# Patient Record
Sex: Male | Born: 1995 | Race: White | Marital: Single
Health system: Southern US, Community
[De-identification: ages and names within clinical notes are randomized; demographics above are authoritative.]

## PROBLEM LIST (undated history)

## (undated) DIAGNOSIS — J45909 Unspecified asthma, uncomplicated: Secondary | ICD-10-CM

---
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
## 2019-01-23 ENCOUNTER — Inpatient Hospital Stay (HOSPITAL_COMMUNITY)
Admission: EM | Admit: 2019-01-23 | Discharge: 2019-01-26 | DRG: 208 | Disposition: A | Discharge status: Home / Self Care | Payer: BC Managed Care – PPO | Attending: General Surgery | Admitting: General Surgery

## 2019-01-23 ENCOUNTER — Emergency Department (HOSPITAL_COMMUNITY): Payer: BC Managed Care – PPO

## 2019-01-23 ENCOUNTER — Encounter (HOSPITAL_COMMUNITY): Payer: Self-pay | Admitting: General Surgery

## 2019-01-23 DIAGNOSIS — S60812A Abrasion of left wrist, initial encounter: Secondary | ICD-10-CM | POA: Diagnosis present

## 2019-01-23 DIAGNOSIS — R402212 Coma scale, best verbal response, none, at arrival to emergency department: Secondary | ICD-10-CM | POA: Diagnosis present

## 2019-01-23 DIAGNOSIS — S70212A Abrasion, left hip, initial encounter: Secondary | ICD-10-CM | POA: Diagnosis present

## 2019-01-23 DIAGNOSIS — S60419A Abrasion of unspecified finger, initial encounter: Secondary | ICD-10-CM | POA: Diagnosis present

## 2019-01-23 DIAGNOSIS — S50312A Abrasion of left elbow, initial encounter: Secondary | ICD-10-CM | POA: Diagnosis present

## 2019-01-23 DIAGNOSIS — R402332 Coma scale, best motor response, abnormal, at arrival to emergency department: Secondary | ICD-10-CM | POA: Diagnosis present

## 2019-01-23 DIAGNOSIS — Z23 Encounter for immunization: Secondary | ICD-10-CM

## 2019-01-23 DIAGNOSIS — S30810A Abrasion of lower back and pelvis, initial encounter: Secondary | ICD-10-CM | POA: Diagnosis present

## 2019-01-23 DIAGNOSIS — T1490XA Injury, unspecified, initial encounter: Secondary | ICD-10-CM | POA: Diagnosis present

## 2019-01-23 DIAGNOSIS — R52 Pain, unspecified: Secondary | ICD-10-CM

## 2019-01-23 DIAGNOSIS — S2232XA Fracture of one rib, left side, initial encounter for closed fracture: Secondary | ICD-10-CM

## 2019-01-23 DIAGNOSIS — F17220 Nicotine dependence, chewing tobacco, uncomplicated: Secondary | ICD-10-CM | POA: Diagnosis present

## 2019-01-23 DIAGNOSIS — R4182 Altered mental status, unspecified: Secondary | ICD-10-CM | POA: Diagnosis not present

## 2019-01-23 DIAGNOSIS — R339 Retention of urine, unspecified: Secondary | ICD-10-CM | POA: Diagnosis not present

## 2019-01-23 DIAGNOSIS — S270XXA Traumatic pneumothorax, initial encounter: Secondary | ICD-10-CM | POA: Diagnosis not present

## 2019-01-23 DIAGNOSIS — Z20828 Contact with and (suspected) exposure to other viral communicable diseases: Secondary | ICD-10-CM | POA: Diagnosis present

## 2019-01-23 DIAGNOSIS — J9601 Acute respiratory failure with hypoxia: Secondary | ICD-10-CM | POA: Diagnosis present

## 2019-01-23 DIAGNOSIS — J939 Pneumothorax, unspecified: Secondary | ICD-10-CM

## 2019-01-23 DIAGNOSIS — S060X9A Concussion with loss of consciousness of unspecified duration, initial encounter: Secondary | ICD-10-CM

## 2019-01-23 DIAGNOSIS — Z01818 Encounter for other preprocedural examination: Secondary | ICD-10-CM

## 2019-01-23 DIAGNOSIS — S060X0A Concussion without loss of consciousness, initial encounter: Secondary | ICD-10-CM | POA: Diagnosis present

## 2019-01-23 DIAGNOSIS — M25512 Pain in left shoulder: Secondary | ICD-10-CM

## 2019-01-23 DIAGNOSIS — S20412A Abrasion of left back wall of thorax, initial encounter: Secondary | ICD-10-CM | POA: Diagnosis present

## 2019-01-23 DIAGNOSIS — Z885 Allergy status to narcotic agent status: Secondary | ICD-10-CM

## 2019-01-23 DIAGNOSIS — R402112 Coma scale, eyes open, never, at arrival to emergency department: Secondary | ICD-10-CM | POA: Diagnosis present

## 2019-01-23 HISTORY — DX: Unspecified asthma, uncomplicated: J45.909

## 2019-01-23 LAB — POCT I-STAT 7, (LYTES, BLD GAS, ICA,H+H)
Acid-base deficit: 1 mmol/L (ref 0.0–2.0)
Bicarbonate: 24 mmol/L (ref 20.0–28.0)
Calcium, Ion: 1.15 mmol/L (ref 1.15–1.40)
HCT: 41 % (ref 39.0–52.0)
Hemoglobin: 13.9 g/dL (ref 13.0–17.0)
O2 Saturation: 100 %
Patient temperature: 98.6
Potassium: 3.5 mmol/L (ref 3.5–5.1)
Sodium: 138 mmol/L (ref 135–145)
TCO2: 25 mmol/L (ref 22–32)
pCO2 arterial: 39.8 mmHg (ref 32.0–48.0)
pH, Arterial: 7.387 (ref 7.350–7.450)
pO2, Arterial: 178 mmHg — ABNORMAL HIGH (ref 83.0–108.0)

## 2019-01-23 LAB — I-STAT CHEM 8, ED
BUN: 15 mg/dL (ref 6–20)
Calcium, Ion: 1.03 mmol/L — ABNORMAL LOW (ref 1.15–1.40)
Chloride: 105 mmol/L (ref 98–111)
Creatinine, Ser: 1 mg/dL (ref 0.61–1.24)
Glucose, Bld: 113 mg/dL — ABNORMAL HIGH (ref 70–99)
HCT: 40 % (ref 39.0–52.0)
Hemoglobin: 13.6 g/dL (ref 13.0–17.0)
Potassium: 3.2 mmol/L — ABNORMAL LOW (ref 3.5–5.1)
Sodium: 138 mmol/L (ref 135–145)
TCO2: 25 mmol/L (ref 22–32)

## 2019-01-23 LAB — COMPREHENSIVE METABOLIC PANEL
ALT: 25 U/L (ref 0–44)
AST: 27 U/L (ref 15–41)
Albumin: 3.9 g/dL (ref 3.5–5.0)
Alkaline Phosphatase: 34 U/L — ABNORMAL LOW (ref 38–126)
Anion gap: 13 (ref 5–15)
BUN: 16 mg/dL (ref 6–20)
CO2: 20 mmol/L — ABNORMAL LOW (ref 22–32)
Calcium: 8.8 mg/dL — ABNORMAL LOW (ref 8.9–10.3)
Chloride: 104 mmol/L (ref 98–111)
Creatinine, Ser: 1.1 mg/dL (ref 0.61–1.24)
GFR calc Af Amer: >60 mL/min (ref >60)
GFR calc non Af Amer: >60 mL/min (ref >60)
Glucose, Bld: 114 mg/dL — ABNORMAL HIGH (ref 70–99)
Potassium: 3.3 mmol/L — ABNORMAL LOW (ref 3.5–5.1)
Sodium: 137 mmol/L (ref 135–145)
Total Bilirubin: 0.5 mg/dL (ref 0.3–1.2)
Total Protein: 6.6 g/dL (ref 6.5–8.1)

## 2019-01-23 LAB — CBC
HCT: 43.5 % (ref 39.0–52.0)
Hemoglobin: 14.8 g/dL (ref 13.0–17.0)
MCH: 31 pg (ref 26.0–34.0)
MCHC: 34 g/dL (ref 30.0–36.0)
MCV: 91.2 fL (ref 80.0–100.0)
Platelets: 310 10*3/uL (ref 150–400)
RBC: 4.77 MIL/uL (ref 4.22–5.81)
RDW: 12.5 % (ref 11.5–15.5)
WBC: 9.9 10*3/uL (ref 4.0–10.5)
nRBC: 0 % (ref 0.0–0.2)

## 2019-01-23 LAB — PROTIME-INR
INR: 1.1 (ref 0.8–1.2)
Prothrombin Time: 14.2 seconds (ref 11.4–15.2)

## 2019-01-23 LAB — ETHANOL: Alcohol, Ethyl (B): <10 mg/dL (ref <10)

## 2019-01-23 LAB — SAMPLE TO BLOOD BANK

## 2019-01-23 LAB — LACTIC ACID, PLASMA: Lactic Acid, Venous: 3 mmol/L (ref 0.5–1.9)

## 2019-01-23 MED ORDER — IOHEXOL 300 MG/ML  SOLN
100.0000 mL | Freq: Once | INTRAMUSCULAR | Status: AC | PRN
  Administered 2019-01-23: 100 mL via INTRAVENOUS

## 2019-01-23 MED ORDER — DEXTROSE-NACL 5-0.9 % IV SOLN
INTRAVENOUS | Status: DC
  Administered 2019-01-24 – 2019-01-25 (×4): via INTRAVENOUS

## 2019-01-23 MED ORDER — MIDAZOLAM HCL 2 MG/2ML IJ SOLN
INTRAMUSCULAR | Status: AC
  Filled 2019-01-23: qty 2

## 2019-01-23 MED ORDER — ENOXAPARIN SODIUM 40 MG/0.4ML ~~LOC~~ SOLN
40.0000 mg | Freq: Every day | SUBCUTANEOUS | Status: DC
  Administered 2019-01-24 – 2019-01-26 (×3): 40 mg via SUBCUTANEOUS
  Filled 2019-01-23 (×4): qty 0.4

## 2019-01-23 MED ORDER — ONDANSETRON HCL 4 MG/2ML IJ SOLN: 4.0000 mg | Freq: Four times a day (QID) | INTRAMUSCULAR | Status: DC | PRN

## 2019-01-23 MED ORDER — PROPOFOL 1000 MG/100ML IV EMUL
INTRAVENOUS | Status: AC
  Filled 2019-01-23: qty 100

## 2019-01-23 MED ORDER — TETANUS-DIPHTH-ACELL PERTUSSIS 5-2.5-18.5 LF-MCG/0.5 IM SUSP
0.5000 mL | Freq: Once | INTRAMUSCULAR | Status: AC
  Administered 2019-01-24: 0.5 mL via INTRAMUSCULAR
  Filled 2019-01-23: qty 0.5

## 2019-01-23 MED ORDER — CHLORHEXIDINE GLUCONATE 0.12% ORAL RINSE (MEDLINE KIT)
15.0000 mL | Freq: Two times a day (BID) | OROMUCOSAL | Status: DC
  Administered 2019-01-24 – 2019-01-25 (×3): 15 mL via OROMUCOSAL

## 2019-01-23 MED ORDER — MIDAZOLAM HCL 5 MG/5ML IJ SOLN
INTRAMUSCULAR | Status: AC | PRN
  Administered 2019-01-23: 2 mg via INTRAVENOUS

## 2019-01-23 MED ORDER — ONDANSETRON 4 MG PO TBDP
4.0000 mg | ORAL_TABLET | Freq: Four times a day (QID) | ORAL | Status: DC | PRN
  Filled 2019-01-23: qty 1

## 2019-01-23 MED ORDER — ORAL CARE MOUTH RINSE
15.0000 mL | OROMUCOSAL | Status: DC
  Administered 2019-01-24 – 2019-01-25 (×13): 15 mL via OROMUCOSAL

## 2019-01-23 MED ORDER — PROPOFOL 1000 MG/100ML IV EMUL
5.0000 ug/kg/min | INTRAVENOUS | Status: DC
  Administered 2019-01-23: via INTRAVENOUS
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
## 2019-01-23 NOTE — ED Notes (Signed)
EMS reports pt was combative and was unable to obtain vital signs.  Pt was given Ketamine 400mg  IM and Versed 4mg  IM.PTA

## 2019-01-23 NOTE — ED Triage Notes (Signed)
Pt's mother came in very assertively informing RN that pt is "not to be given any opioids"  He has "been clean for 4 years and under no circumstances not to be given any opioids"  Message given to provider personally.

## 2019-01-23 NOTE — ED Notes (Signed)
Sort RN updated pt's mother that we are continuing to work w/ the PT and that he is getting imaging done at this time per provider.  She continues to demand that he does not get any opioids.
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
## 2019-01-24 ENCOUNTER — Inpatient Hospital Stay (HOSPITAL_COMMUNITY): Payer: BC Managed Care – PPO

## 2019-01-24 DIAGNOSIS — S2232XA Fracture of one rib, left side, initial encounter for closed fracture: Secondary | ICD-10-CM | POA: Diagnosis present

## 2019-01-24 DIAGNOSIS — F17220 Nicotine dependence, chewing tobacco, uncomplicated: Secondary | ICD-10-CM | POA: Diagnosis present

## 2019-01-24 DIAGNOSIS — R402212 Coma scale, best verbal response, none, at arrival to emergency department: Secondary | ICD-10-CM | POA: Diagnosis present

## 2019-01-24 DIAGNOSIS — T1490XA Injury, unspecified, initial encounter: Secondary | ICD-10-CM | POA: Diagnosis present

## 2019-01-24 DIAGNOSIS — Z885 Allergy status to narcotic agent status: Secondary | ICD-10-CM | POA: Diagnosis not present

## 2019-01-24 DIAGNOSIS — S060X0A Concussion without loss of consciousness, initial encounter: Secondary | ICD-10-CM | POA: Diagnosis present

## 2019-01-24 DIAGNOSIS — R339 Retention of urine, unspecified: Secondary | ICD-10-CM | POA: Diagnosis not present

## 2019-01-24 DIAGNOSIS — J9601 Acute respiratory failure with hypoxia: Secondary | ICD-10-CM | POA: Diagnosis present

## 2019-01-24 DIAGNOSIS — R402112 Coma scale, eyes open, never, at arrival to emergency department: Secondary | ICD-10-CM | POA: Diagnosis present

## 2019-01-24 DIAGNOSIS — Z20828 Contact with and (suspected) exposure to other viral communicable diseases: Secondary | ICD-10-CM | POA: Diagnosis present

## 2019-01-24 DIAGNOSIS — S270XXA Traumatic pneumothorax, initial encounter: Secondary | ICD-10-CM | POA: Diagnosis present

## 2019-01-24 DIAGNOSIS — S30810A Abrasion of lower back and pelvis, initial encounter: Secondary | ICD-10-CM | POA: Diagnosis present

## 2019-01-24 DIAGNOSIS — Z23 Encounter for immunization: Secondary | ICD-10-CM | POA: Diagnosis not present

## 2019-01-24 DIAGNOSIS — R4182 Altered mental status, unspecified: Secondary | ICD-10-CM | POA: Diagnosis present

## 2019-01-24 DIAGNOSIS — R402332 Coma scale, best motor response, abnormal, at arrival to emergency department: Secondary | ICD-10-CM | POA: Diagnosis present

## 2019-01-24 LAB — URINALYSIS, ROUTINE W REFLEX MICROSCOPIC
Bilirubin Urine: NEGATIVE
Glucose, UA: NEGATIVE mg/dL
Hgb urine dipstick: NEGATIVE
Ketones, ur: NEGATIVE mg/dL
Leukocytes,Ua: NEGATIVE
Nitrite: NEGATIVE
Protein, ur: NEGATIVE mg/dL
Specific Gravity, Urine: >1.046 — ABNORMAL HIGH (ref 1.005–1.030)
pH: 6 (ref 5.0–8.0)

## 2019-01-24 LAB — BASIC METABOLIC PANEL
Anion gap: 10 (ref 5–15)
BUN: 13 mg/dL (ref 6–20)
CO2: 23 mmol/L (ref 22–32)
Calcium: 8.7 mg/dL — ABNORMAL LOW (ref 8.9–10.3)
Chloride: 104 mmol/L (ref 98–111)
Creatinine, Ser: 1.16 mg/dL (ref 0.61–1.24)
GFR calc Af Amer: >60 mL/min (ref >60)
GFR calc non Af Amer: >60 mL/min (ref >60)
Glucose, Bld: 124 mg/dL — ABNORMAL HIGH (ref 70–99)
Potassium: 4.1 mmol/L (ref 3.5–5.1)
Sodium: 137 mmol/L (ref 135–145)

## 2019-01-24 LAB — CBC
HCT: 39.2 % (ref 39.0–52.0)
Hemoglobin: 13.5 g/dL (ref 13.0–17.0)
MCH: 31.1 pg (ref 26.0–34.0)
MCHC: 34.4 g/dL (ref 30.0–36.0)
MCV: 90.3 fL (ref 80.0–100.0)
Platelets: 274 10*3/uL (ref 150–400)
RBC: 4.34 MIL/uL (ref 4.22–5.81)
RDW: 12.6 % (ref 11.5–15.5)
WBC: 13 10*3/uL — ABNORMAL HIGH (ref 4.0–10.5)
nRBC: 0 % (ref 0.0–0.2)

## 2019-01-24 LAB — RAPID URINE DRUG SCREEN, HOSP PERFORMED
Amphetamines: NOT DETECTED
Barbiturates: NOT DETECTED
Benzodiazepines: POSITIVE — AB
Cocaine: NOT DETECTED
Opiates: NOT DETECTED
Tetrahydrocannabinol: NOT DETECTED

## 2019-01-24 LAB — CDS SEROLOGY

## 2019-01-24 LAB — HIV ANTIBODY (ROUTINE TESTING W REFLEX): HIV Screen 4th Generation wRfx: NONREACTIVE

## 2019-01-24 LAB — MRSA PCR SCREENING: MRSA by PCR: NEGATIVE

## 2019-01-24 LAB — TRIGLYCERIDES: Triglycerides: 92 mg/dL (ref <150)

## 2019-01-24 LAB — SARS CORONAVIRUS 2 BY RT PCR (HOSPITAL ORDER, PERFORMED IN ~~LOC~~ HOSPITAL LAB): SARS Coronavirus 2: NEGATIVE

## 2019-01-24 MED ORDER — MIDAZOLAM 50MG/50ML (1MG/ML) PREMIX INFUSION
0.5000 mg/h | INTRAVENOUS | Status: DC
  Filled 2019-01-24: qty 50

## 2019-01-24 MED ORDER — BETHANECHOL CHLORIDE 10 MG PO TABS
25.0000 mg | ORAL_TABLET | Freq: Three times a day (TID) | ORAL | Status: DC
  Administered 2019-01-24 (×3): 25 mg
  Filled 2019-01-24 (×3): qty 3

## 2019-01-24 MED ORDER — CHLORHEXIDINE GLUCONATE CLOTH 2 % EX PADS
6.0000 | MEDICATED_PAD | Freq: Every day | CUTANEOUS | Status: DC
  Administered 2019-01-25: 6 via TOPICAL

## 2019-01-24 MED ORDER — MIDAZOLAM 50MG/50ML (1MG/ML) PREMIX INFUSION
0.5000 mg/h | INTRAVENOUS | Status: DC
  Administered 2019-01-24: 1 mg/h via INTRAVENOUS

## 2019-01-24 MED ORDER — ACETAMINOPHEN 160 MG/5ML PO SOLN
650.0000 mg | Freq: Four times a day (QID) | ORAL | Status: DC
  Filled 2019-01-24: qty 20.3

## 2019-01-24 MED ORDER — PROPOFOL 1000 MG/100ML IV EMUL
INTRAVENOUS | Status: AC
  Filled 2019-01-24: qty 100

## 2019-01-24 MED ORDER — PANTOPRAZOLE SODIUM 40 MG PO PACK
40.0000 mg | PACK | Freq: Every day | ORAL | Status: DC
  Administered 2019-01-24: 40 mg
  Filled 2019-01-24: qty 20

## 2019-01-24 MED ORDER — ACETAMINOPHEN 160 MG/5ML PO SOLN
650.0000 mg | Freq: Four times a day (QID) | ORAL | Status: DC
  Administered 2019-01-24 – 2019-01-25 (×5): 650 mg
  Filled 2019-01-24 (×4): qty 20.3

## 2019-01-24 MED ORDER — MIDAZOLAM HCL 2 MG/2ML IJ SOLN
INTRAMUSCULAR | Status: AC
  Administered 2019-01-24: 03:00:00
  Filled 2019-01-24: qty 2

## 2019-01-24 MED ORDER — DEXMEDETOMIDINE HCL IN NACL 400 MCG/100ML IV SOLN
0.4000 ug/kg/h | INTRAVENOUS | Status: DC
  Administered 2019-01-24: 1 ug/kg/h via INTRAVENOUS
  Administered 2019-01-24 – 2019-01-25 (×6): 1.2 ug/kg/h via INTRAVENOUS
  Filled 2019-01-24 (×3): qty 100
  Filled 2019-01-24: qty 300
  Filled 2019-01-24 (×3): qty 100

## 2019-01-24 MED ORDER — MIDAZOLAM 50MG/50ML (1MG/ML) PREMIX INFUSION
0.0000 mg/h | INTRAVENOUS | Status: DC
  Administered 2019-01-24: 1 mg/h via INTRAVENOUS
  Administered 2019-01-24: 4 mg/h via INTRAVENOUS
  Administered 2019-01-25: 2.5 mg/h via INTRAVENOUS
  Filled 2019-01-24 (×3): qty 50

## 2019-01-24 NOTE — ED Notes (Signed)
Unable to stick pt. Very conbative

## 2019-01-24 NOTE — Progress Notes (Signed)
Updated patient's mother.  She insists he does not get any opioid rx.  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  I d/w her that he is intubated and will be going to the ICU overnight.

## 2019-01-24 NOTE — Progress Notes (Signed)
Assisted tele visit to patient with mother and sister.  Tenea Sens M Ajee Heasley, RN   

## 2019-01-24 NOTE — ED Notes (Signed)
Pt extremely agitated attempting to extubate himself. Page sent to Dr. Rosendo Gros for further orders.

## 2019-01-24 NOTE — ED Notes (Signed)
Attempted to call patient's mother on cellphone and house phone, no answer, voicemail full.

## 2019-01-24 NOTE — ED Notes (Signed)
Spoke with patient placement about bed assignment.

## 2019-01-24 NOTE — Consult Note (Signed)
Responded to initial page & later upgrade. Pt unavailable, ultimately accompanied doctor to update pt's mother and sister on status. They did not initially seem to  understand intubation meant pt's condition was critical. 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 Provided emotional support and accompanied therm outside to wait with other family members for pt to go upstairs. Hours later, charge nurse said mother could see pt in Trauma A for 5 minutes, where pt still was, but did not find her. Message left with Security. Standing by.   Rev. Eloise Levels Chaplain

## 2019-01-24 NOTE — Progress Notes (Signed)
Patient transported to 4N15 without any complications.
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
## 2019-01-24 NOTE — ED Notes (Signed)
Pt combative and agitated maxed on precedex, page sent to Dr. Rosendo Gros for further orders

## 2019-01-25 ENCOUNTER — Encounter (HOSPITAL_COMMUNITY): Payer: Self-pay | Admitting: *Deleted

## 2019-01-25 ENCOUNTER — Other Ambulatory Visit: Payer: Self-pay

## 2019-01-25 ENCOUNTER — Inpatient Hospital Stay (HOSPITAL_COMMUNITY): Payer: BC Managed Care – PPO

## 2019-01-25 LAB — CBC
HCT: 35.7 % — ABNORMAL LOW (ref 39.0–52.0)
Hemoglobin: 12 g/dL — ABNORMAL LOW (ref 13.0–17.0)
MCH: 30.8 pg (ref 26.0–34.0)
MCHC: 33.6 g/dL (ref 30.0–36.0)
MCV: 91.8 fL (ref 80.0–100.0)
Platelets: 226 10*3/uL (ref 150–400)
RBC: 3.89 MIL/uL — ABNORMAL LOW (ref 4.22–5.81)
RDW: 12.9 % (ref 11.5–15.5)
WBC: 9.1 10*3/uL (ref 4.0–10.5)
nRBC: 0 % (ref 0.0–0.2)

## 2019-01-25 LAB — BASIC METABOLIC PANEL
Anion gap: 7 (ref 5–15)
BUN: 8 mg/dL (ref 6–20)
CO2: 22 mmol/L (ref 22–32)
Calcium: 8.5 mg/dL — ABNORMAL LOW (ref 8.9–10.3)
Chloride: 113 mmol/L — ABNORMAL HIGH (ref 98–111)
Creatinine, Ser: 0.82 mg/dL (ref 0.61–1.24)
GFR calc Af Amer: >60 mL/min (ref >60)
GFR calc non Af Amer: >60 mL/min (ref >60)
Glucose, Bld: 132 mg/dL — ABNORMAL HIGH (ref 70–99)
Potassium: 3.4 mmol/L — ABNORMAL LOW (ref 3.5–5.1)
Sodium: 142 mmol/L (ref 135–145)

## 2019-01-25 MED ORDER — IBUPROFEN 800 MG PO TABS
800.0000 mg | ORAL_TABLET | Freq: Three times a day (TID) | ORAL | Status: DC | PRN
  Administered 2019-01-25: 800 mg via ORAL
  Filled 2019-01-25: qty 4

## 2019-01-25 MED ORDER — NICOTINE 14 MG/24HR TD PT24
14.0000 mg | MEDICATED_PATCH | Freq: Every day | TRANSDERMAL | Status: DC
  Administered 2019-01-25: 14 mg via TRANSDERMAL
  Filled 2019-01-25 (×2): qty 1

## 2019-01-25 MED ORDER — ACETAMINOPHEN 160 MG/5ML PO SOLN
650.0000 mg | Freq: Four times a day (QID) | ORAL | Status: DC
  Administered 2019-01-25: 650 mg via ORAL
  Filled 2019-01-25: qty 20.3

## 2019-01-25 MED ORDER — GABAPENTIN 600 MG PO TABS
300.0000 mg | ORAL_TABLET | Freq: Three times a day (TID) | ORAL | Status: DC
  Administered 2019-01-25 – 2019-01-26 (×5): 300 mg via ORAL
  Filled 2019-01-25 (×5): qty 1

## 2019-01-25 MED ORDER — ACETAMINOPHEN 325 MG PO TABS
650.0000 mg | ORAL_TABLET | Freq: Four times a day (QID) | ORAL | Status: DC
  Administered 2019-01-25 – 2019-01-26 (×4): 650 mg via ORAL
  Filled 2019-01-25 (×4): qty 2

## 2019-01-25 MED ORDER — BETHANECHOL CHLORIDE 10 MG PO TABS
25.0000 mg | ORAL_TABLET | Freq: Three times a day (TID) | ORAL | Status: DC
  Administered 2019-01-25 (×2): 25 mg via ORAL
  Filled 2019-01-25 (×2): qty 3

## 2019-01-25 NOTE — Procedures (Signed)
Extubation Procedure Note  Patient Details:   Name: Paul Jackson DOB: March 16, 1996 MRN: [id number]   Airway Documentation:    Vent end date: 01/25/19 Vent end time: 0829   Evaluation  O2 sats: stable throughout Complications: No apparent complications Patient did tolerate procedure well. Bilateral Breath Sounds: Diminished   Yes   Pt extubated to 2L per MD order. Positive cuff leak noted prior to extubation. No stridor noted. Pt stable throughout procedure. RT will continue to monitor.   Esperanza Sheets T 01/25/2019, 8:32 AM
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
## 2019-01-25 NOTE — Progress Notes (Signed)
Assisted tele visit to patient with girlfriend.  Paul Clemenson M Star Resler, RN   

## 2019-01-25 NOTE — Progress Notes (Signed)
Assisted tele visit to patient with family member.  Thomas, Zigmund Linse Renee, RN   
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
## 2019-01-26 ENCOUNTER — Inpatient Hospital Stay (HOSPITAL_COMMUNITY): Payer: BC Managed Care – PPO

## 2019-01-26 LAB — BASIC METABOLIC PANEL
Anion gap: 8 (ref 5–15)
BUN: <5 mg/dL — ABNORMAL LOW (ref 6–20)
CO2: 25 mmol/L (ref 22–32)
Calcium: 8.6 mg/dL — ABNORMAL LOW (ref 8.9–10.3)
Chloride: 109 mmol/L (ref 98–111)
Creatinine, Ser: 0.83 mg/dL (ref 0.61–1.24)
GFR calc Af Amer: >60 mL/min (ref >60)
GFR calc non Af Amer: >60 mL/min (ref >60)
Glucose, Bld: 96 mg/dL (ref 70–99)
Potassium: 3.2 mmol/L — ABNORMAL LOW (ref 3.5–5.1)
Sodium: 142 mmol/L (ref 135–145)

## 2019-01-26 LAB — CBC
HCT: 35.8 % — ABNORMAL LOW (ref 39.0–52.0)
Hemoglobin: 12.4 g/dL — ABNORMAL LOW (ref 13.0–17.0)
MCH: 31.3 pg (ref 26.0–34.0)
MCHC: 34.6 g/dL (ref 30.0–36.0)
MCV: 90.4 fL (ref 80.0–100.0)
Platelets: 261 10*3/uL (ref 150–400)
RBC: 3.96 MIL/uL — ABNORMAL LOW (ref 4.22–5.81)
RDW: 12.3 % (ref 11.5–15.5)
WBC: 9.4 10*3/uL (ref 4.0–10.5)
nRBC: 0 % (ref 0.0–0.2)

## 2019-01-26 MED ORDER — ENSURE ENLIVE PO LIQD
237.0000 mL | Freq: Three times a day (TID) | ORAL | Status: DC
  Administered 2019-01-26: 237 mL via ORAL

## 2019-01-26 MED ORDER — POTASSIUM CHLORIDE CRYS ER 20 MEQ PO TBCR
40.0000 meq | EXTENDED_RELEASE_TABLET | Freq: Two times a day (BID) | ORAL | Status: DC
  Administered 2019-01-26: 40 meq via ORAL
  Filled 2019-01-26: qty 2

## 2019-01-26 MED ORDER — BACITRACIN ZINC 500 UNIT/GM EX OINT
TOPICAL_OINTMENT | Freq: Two times a day (BID) | CUTANEOUS | Status: DC
  Administered 2019-01-26: 31.5556 via TOPICAL
  Filled 2019-01-26: qty 28.4

## 2019-01-26 MED ORDER — BACITRACIN ZINC 500 UNIT/GM EX OINT: TOPICAL_OINTMENT | Freq: Two times a day (BID) | CUTANEOUS | 0 refills | Status: AC

## 2019-01-26 MED ORDER — IBUPROFEN 800 MG PO TABS: 800.0000 mg | ORAL_TABLET | Freq: Three times a day (TID) | ORAL | 0 refills | Status: AC | PRN

## 2019-01-26 MED ORDER — NICOTINE 21 MG/24HR TD PT24
21.0000 mg | MEDICATED_PATCH | Freq: Every day | TRANSDERMAL | Status: DC
  Administered 2019-01-26: 21 mg via TRANSDERMAL
  Filled 2019-01-26: qty 1

## 2019-01-26 MED ORDER — ADULT MULTIVITAMIN W/MINERALS CH
1.0000 | ORAL_TABLET | Freq: Every day | ORAL | Status: DC
  Administered 2019-01-26: 1 via ORAL
  Filled 2019-01-26: qty 1

## 2019-01-26 MED ORDER — NICOTINE 21 MG/24HR TD PT24: 21.0000 mg | MEDICATED_PATCH | Freq: Every day | TRANSDERMAL | 0 refills | Status: AC

## 2019-01-26 MED ORDER — ACETAMINOPHEN 325 MG PO TABS: 650.0000 mg | ORAL_TABLET | Freq: Four times a day (QID) | ORAL | Status: AC

## 2019-01-26 MED ORDER — ADULT MULTIVITAMIN W/MINERALS CH: 1.0000 | ORAL_TABLET | Freq: Every day | ORAL | Status: AC
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
## 2019-01-26 NOTE — Progress Notes (Signed)
Orthopedic Tech Progress Note Patient Details:  Paul Jackson 1996/02/08 [id number]  Ortho Devices Type of Ortho Device: Sling immobilizer Ortho Device/Splint Location: ULE Ortho Device/Splint Interventions: Adjustment, Application, Ordered   Post Interventions Patient Tolerated: Well Instructions Provided: Care of device, Adjustment of device   [name] 01/26/2019, 6:01 PM
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
## 2019-01-26 NOTE — Discharge Instructions (Addendum)
For left shoulder: Sling as needed for comfort You will need to have an MRI done and then follow up with Dr. Carola FrostHandy  For abrasions: Neosporin or bacitracin ointment as needed    Concussion, Adult A concussion is a brain injury from a direct hit (blow) to the head or body. This injury causes the brain to shake quickly back and forth inside the skull. It is caused by:  A hit to the head.  A quick and sudden movement (jolt) of the head or neck. How fast you will get better from a concussion depends on many things. Recovery can take time. It is important to wait to return to activity until a doctor says it is safe and your symptoms are all gone. Follow these instructions at home: Activity  Limit activities that need a lot of thought or concentration. You may need to talk with your work Production designer, theatre/television/filmmanager or teachers about this. Limit activities such as: ? Homework or work for your job. ? Watching TV. ? Computer work. ? Playing memory games and puzzles.  Rest. Rest helps the brain to heal. Make sure you: ? Get plenty of sleep at night. Do not stay up late. ? Rest during the day. Take naps or rest breaks when you feel tired.  Do not do activities that could cause a second concussion, such as riding a bike or playing sports. It can be dangerous if you get another concussion before the first one has healed.  Ask your doctor when you can return to your normal activities, like driving, riding a bike, or using machinery. Your ability to react may be slower. Do not do these activities if you are dizzy. Your doctor will likely give you a plan for slowly going back to activities. General instructions  Take over-the-counter and prescription medicines only as told by your doctor.  Do not drink alcohol until your doctor says you can.  Watch your symptoms and tell other people to do the same. Other problems (complications) can happen after a concussion. Older adults with a brain injury may have a higher risk  of serious problems, such as a blood clot in the brain.  Tell your work Production designer, theatre/television/filmmanager, teachers, Tax adviserschool nurse, school counselor, coach, or Event organiserathletic trainer about your injury and symptoms. Tell them about what you can or cannot do. They should watch you for: ? More problems with attention or concentration. ? More trouble remembering or learning new information. ? More time needed to do tasks or assignments. ? Being more annoyed (irritable) or having a harder time dealing with stress. ? Any other symptoms that get worse.  Keep all follow-up visits as told by your doctor. This is important. Prevention  It is very important that you donot get another brain injury, especially before you have healed. In rare cases, another injury can cause permanent brain damage, brain swelling, or death. You have the most risk if you get another head injury in the first 7-10 days after you were hurt before. To avoid injuries: ? Avoid activities that could make you get a second concussion, like contact sports. ? When you have returned to sports or activities:  Avoid plays or moves that can cause you to crash into another person. This is how most concussions happen.  Follow the rules and be respectful of other players. ? Get regular exercise that includes strength and balance training. ? Wear a helmet when you do activities like:  Biking.  Skiing.  Skateboarding.  Skating. ? Helmets can help  protect you from serious skull and brain injuries, but they do not protect your from a concussion. Even when wearing a helmet, you should avoid being hit in the head. Contact a doctor if:  Your symptoms get worse or they do not get better.  You have new symptoms.  You have another injury. Get help right away if:  You have bad headaches or your headaches get worse.  You have weakness in any part of your body.  You are confused.  Your coordination gets worse.  You keep throwing up (vomiting).  You feel more  sleepy than normal.  You twitch or shake violently (convulse) or have a seizure.  Your speech is not clear (is slurred).  You have strange behavior changes.  You have changes in how you see (vision).  You pass out (lose consciousness). Summary  A concussion is a brain injury from a direct hit (blow) to the head or body.  This condition is treated with rest and careful watching of symptoms.  If you keep having symptoms, call your doctor. This information is not intended to replace advice given to you by your health care provider. Make sure you discuss any questions you have with your health care provider. Document Released: 07/08/2009 Document Revised: 08/31/2017 Document Reviewed: 08/31/2017 Elsevier Interactive Patient Education  2019 [employer].
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
## 2019-01-28 ENCOUNTER — Emergency Department (HOSPITAL_COMMUNITY)
Admission: EM | Admit: 2019-01-28 | Discharge: 2019-01-28 | Disposition: A | Discharge status: Home / Self Care | Payer: BC Managed Care – PPO | Attending: Emergency Medicine | Admitting: Emergency Medicine

## 2019-01-28 ENCOUNTER — Emergency Department (HOSPITAL_COMMUNITY): Payer: BC Managed Care – PPO

## 2019-01-28 ENCOUNTER — Other Ambulatory Visit: Payer: Self-pay

## 2019-01-28 ENCOUNTER — Encounter (HOSPITAL_COMMUNITY): Payer: Self-pay

## 2019-01-28 DIAGNOSIS — R079 Chest pain, unspecified: Secondary | ICD-10-CM | POA: Diagnosis present

## 2019-01-28 DIAGNOSIS — F17220 Nicotine dependence, chewing tobacco, uncomplicated: Secondary | ICD-10-CM | POA: Diagnosis not present

## 2019-01-28 DIAGNOSIS — J939 Pneumothorax, unspecified: Secondary | ICD-10-CM | POA: Diagnosis not present

## 2019-01-28 DIAGNOSIS — Z79899 Other long term (current) drug therapy: Secondary | ICD-10-CM | POA: Insufficient documentation

## 2019-01-28 MED ORDER — KETOROLAC TROMETHAMINE 15 MG/ML IJ SOLN
15.0000 mg | Freq: Once | INTRAMUSCULAR | Status: AC
  Administered 2019-01-28: 15 mg via INTRAVENOUS
  Filled 2019-01-28: qty 1

## 2019-01-28 MED ORDER — AZITHROMYCIN 250 MG PO TABS: 250.0000 mg | ORAL_TABLET | Freq: Every day | ORAL | 0 refills | Status: AC

## 2019-01-28 MED ORDER — LIDOCAINE 5 % EX PTCH: 1.0000 | MEDICATED_PATCH | CUTANEOUS | 0 refills | Status: AC
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
## 2019-01-28 NOTE — ED Notes (Signed)
Patient verbalizes understanding of discharge instructions. Opportunity for questioning and answers were provided. Pt discharged from ED. 

## 2019-01-28 NOTE — Discharge Instructions (Addendum)
If symptoms are not improving or are worsening, or you develop a fever or cough, please fill the prescription for Azithromycin (antibiotic) Continue to use the incentive spirometer every 1-2 hours while awake Continue Tylenol and Ibuprofen for pain Try Lidocaine patch on the area that hurts the most. You can also apply heat or ice to the area Return if worsening

## 2019-01-28 NOTE — ED Notes (Signed)
Patient transported to X-ray 

## 2019-02-07 ENCOUNTER — Ambulatory Visit: Payer: BC Managed Care – PPO | Attending: Physician Assistant | Admitting: Occupational Therapy

## 2019-02-07 ENCOUNTER — Other Ambulatory Visit: Payer: Self-pay

## 2019-02-07 DIAGNOSIS — R41844 Frontal lobe and executive function deficit: Secondary | ICD-10-CM | POA: Diagnosis not present

## 2019-02-07 DIAGNOSIS — M6281 Muscle weakness (generalized): Secondary | ICD-10-CM | POA: Insufficient documentation

## 2019-02-07 DIAGNOSIS — R41840 Attention and concentration deficit: Secondary | ICD-10-CM | POA: Diagnosis present
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
## 2019-02-15 ENCOUNTER — Ambulatory Visit: Payer: BC Managed Care – PPO | Admitting: Occupational Therapy

## 2019-02-15 ENCOUNTER — Other Ambulatory Visit: Payer: Self-pay

## 2019-02-15 DIAGNOSIS — R41840 Attention and concentration deficit: Secondary | ICD-10-CM

## 2019-02-15 DIAGNOSIS — M6281 Muscle weakness (generalized): Secondary | ICD-10-CM

## 2019-02-15 DIAGNOSIS — R41844 Frontal lobe and executive function deficit: Secondary | ICD-10-CM | POA: Diagnosis not present
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
## 2019-02-15 NOTE — Patient Instructions (Addendum)
Memory Compensation Strategies  1. Use "WARM" strategy.  W= write it down  A= associate it  R= repeat it  M= make a mental note  2.   You can keep a Social worker.  Use a notebook with sections for the following: calendar, important names and phone numbers,  medications, doctors' names/phone numbers, lists/reminders, and a section to journal what you did  each day.   3.    Use a calendar to write appointments down.  4.    Write yourself a schedule for the day.  This can be placed on the calendar or in a separate section of the Memory Notebook.  Keeping a  regular schedule can help memory.  5.    Use medication organizer with sections for each day or morning/evening pills.  You may need help loading it  6.    Keep a basket, or pegboard by the door.  Place items that you need to take out with you in the basket or on the pegboard.  You may also want to  include a message board for reminders.  7.    Use sticky notes.  Place sticky notes with reminders in a place where the task is performed.  For example: " turn off the  stove" placed by the stove, "lock the door" placed on the door at eye level, " take your medications" on  the bathroom mirror or by the place where you normally take your medications.  8.    Use alarms/timers.  Use while cooking to remind yourself to check on food or as a reminder to take your medicine, or as a  reminder to make a call, or as a reminder to perform another task, etc.     Strengthening: Resisted Flexion   Hold tubing with __left___ arm(s) at side. Pull forward and up. Move shoulder through pain-free range of motion. Repeat __10__ times per set.  Do _1-2_ sessions per day , every other day   Strengthening: Resisted Extension   Hold tubing in _left or both ____ hand(s), arm forward. Pull arm back, elbow straight. Repeat _10___ times per set. Do _1-2___ sessions per day, every other day.   Resisted Horizontal Abduction: Bilateral   Sit or  stand, tubing in both hands, arms out in front. Keeping arms straight, pinch shoulder blades together and stretch arms out. Repeat _10___ times per set. Do _1-2___ sessions per day, every other day.   Arm: External Rotation    Stand facing tubing at elbow height, elbow at side, forearm across body. Pull forearm away from body as far as possible, keeping elbow at side. Move slowly and deliberately. Repeat _10___ times. Do _2___ sessions per day.  Copyright  VHI. All rights reserved.  Internal Rotation (Resistive Band)    With elbow bent at right angle, hold firmly against side. Using doorknob to anchor band, pull inward. Hold ____ seconds. Repeat _10___ times. Do __2__ sessions per day.  Copyright  VHI. All rights reserved.

## 2019-02-28 ENCOUNTER — Ambulatory Visit: Payer: BC Managed Care – PPO | Admitting: Rehabilitative and Restorative Service Providers"

## 2019-02-28 ENCOUNTER — Other Ambulatory Visit: Payer: Self-pay

## 2019-02-28 ENCOUNTER — Encounter: Payer: Self-pay | Admitting: Rehabilitative and Restorative Service Providers"
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
## 2019-07-06 ENCOUNTER — Other Ambulatory Visit: Payer: Self-pay

## 2019-07-06 DIAGNOSIS — Z20822 Contact with and (suspected) exposure to covid-19: Secondary | ICD-10-CM

## 2019-07-09 LAB — NOVEL CORONAVIRUS, NAA: SARS-CoV-2, NAA: NOT DETECTED
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
## 2020-10-24 IMAGING — DX PORTABLE CHEST - 1 VIEW
1 series · 1 of 1 positions shown · non-contrast
Comparison: Radiograph January 24, 2019.

CLINICAL DATA: Left pneumothorax.

EXAM:
PORTABLE CHEST 1 VIEW

[chest ap]
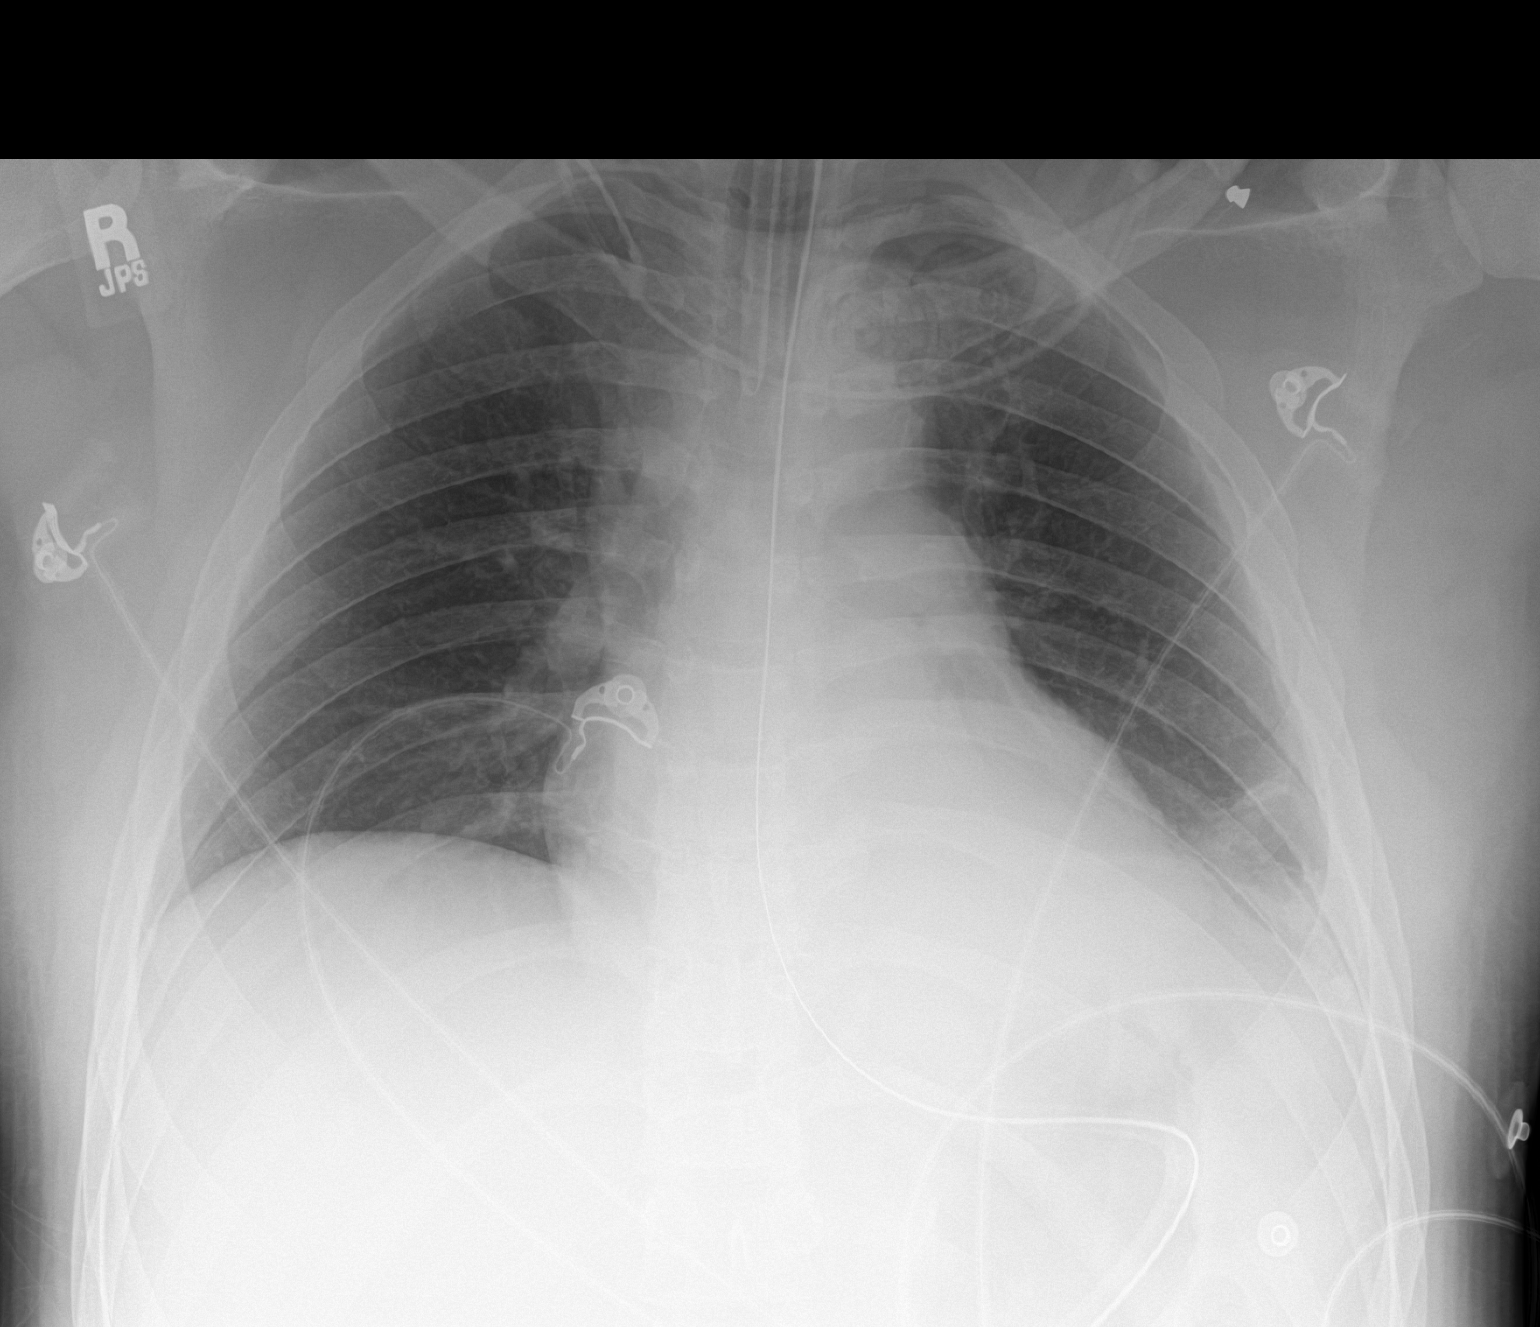

[1 of 1 positions shown; findings below may reference images not displayed]

FINDINGS: Stable cardiomediastinal silhouette. Endotracheal and nasogastric
tubes are unchanged in position. Right lung is clear. Minimal left
apical pneumothorax is noted which is slightly decreased compared to
prior exam. Mild left basilar subsegmental atelectasis is noted with
probable minimal left pleural effusion. Bony thorax unremarkable.
IMPRESSION: Stable support apparatus. Minimal left apical pneumothorax is noted
which is slightly decreased compared to prior exam. Mild left
basilar subsegmental atelectasis is noted with probable minimal left
pleural effusion.

## 2023-04-18 ENCOUNTER — Ambulatory Visit (HOSPITAL_COMMUNITY)
Admission: RE | Admit: 2023-04-18 | Discharge: 2023-04-18 | Disposition: A | Discharge status: Home / Self Care | Payer: Self-pay | Source: Ambulatory Visit | Attending: Emergency Medicine | Admitting: Emergency Medicine

## 2023-04-18 ENCOUNTER — Encounter (HOSPITAL_COMMUNITY): Payer: Self-pay

## 2023-04-18 VITALS — BP 136/90 | HR 77 | Temp 98.6°F | Resp 16 | Ht 68.0 in | Wt 213.0 lb

## 2023-04-18 DIAGNOSIS — N50811 Right testicular pain: Secondary | ICD-10-CM | POA: Insufficient documentation

## 2023-04-18 LAB — POCT URINALYSIS DIP (MANUAL ENTRY)
Bilirubin, UA: NEGATIVE
Blood, UA: NEGATIVE
Glucose, UA: NEGATIVE mg/dL
Ketones, POC UA: NEGATIVE mg/dL
Leukocytes, UA: NEGATIVE
Nitrite, UA: NEGATIVE
Protein Ur, POC: NEGATIVE mg/dL
Spec Grav, UA: 1.015 (ref 1.010–1.025)
Urobilinogen, UA: 0.2 U/dL
pH, UA: 6.5 (ref 5.0–8.0)

## 2023-04-18 NOTE — ED Triage Notes (Signed)
Swelling in the right testicle and states I felt hard, having pain as well. No injuries or falls that the Patient known of. Onset 3 days ago.   Patient states several months ago there was some testicular selling with no pain. States they possible saw a "hydro cell"
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
## 2023-04-18 NOTE — Discharge Instructions (Addendum)
Your urine did not show any signs of infection.  We will contact you if there is anything abnormal on your cytology swab.  Overall your physical exam was reassuring, I do not believe you are suffering from an emergent condition such as torsion. You can keep your testicles compressed with tight underwear like briefs, ice, and take 800 mg of ibuprofen every 8 hours for pain and inflammation.   Please seek immediate care at the nearest emergency department if you develop severe testicular pain, sudden swelling, or any new concerning symptoms.

## 2023-04-19 LAB — CYTOLOGY, (ORAL, ANAL, URETHRAL) ANCILLARY ONLY
Chlamydia: NEGATIVE
Comment: NEGATIVE
Comment: NEGATIVE
Comment: NORMAL
Neisseria Gonorrhea: NEGATIVE
Trichomonas: NEGATIVE
